# Patient Record
Sex: Female | Born: 1980 | Race: White | Hispanic: No
Health system: Southern US, Community
[De-identification: ages and names within clinical notes are randomized; demographics above are authoritative.]

---

## 1999-01-30 ENCOUNTER — Other Ambulatory Visit: Admission: RE | Admit: 1999-01-30 | Discharge: 1999-01-30 | Payer: Self-pay | Admitting: Gynecology

## 2009-03-01 ENCOUNTER — Other Ambulatory Visit: Admission: RE | Admit: 2009-03-01 | Discharge: 2009-03-01 | Payer: Self-pay | Admitting: Family Medicine

## 2010-03-04 ENCOUNTER — Other Ambulatory Visit: Admission: RE | Admit: 2010-03-04 | Discharge: 2010-03-04 | Payer: Self-pay | Admitting: Family Medicine

## 2010-03-31 ENCOUNTER — Emergency Department (HOSPITAL_COMMUNITY)
Admission: EM | Admit: 2010-03-31 | Discharge: 2010-03-31 | Payer: Self-pay | Source: Home / Self Care | Admitting: Emergency Medicine

## 2010-06-09 ENCOUNTER — Emergency Department (HOSPITAL_COMMUNITY)
Admission: EM | Admit: 2010-06-09 | Discharge: 2010-06-09 | Payer: Self-pay | Source: Home / Self Care | Admitting: Family Medicine

## 2010-08-06 ENCOUNTER — Inpatient Hospital Stay (INDEPENDENT_AMBULATORY_CARE_PROVIDER_SITE_OTHER)
Admission: RE | Admit: 2010-08-06 | Discharge: 2010-08-06 | Disposition: A | Discharge status: Home / Self Care | Payer: Commercial Managed Care - PPO | Source: Ambulatory Visit | Attending: Emergency Medicine | Admitting: Emergency Medicine

## 2010-08-06 DIAGNOSIS — J069 Acute upper respiratory infection, unspecified: Secondary | ICD-10-CM

## 2011-07-24 ENCOUNTER — Other Ambulatory Visit (HOSPITAL_COMMUNITY)
Admission: RE | Admit: 2011-07-24 | Discharge: 2011-07-24 | Disposition: A | Discharge status: Home / Self Care | Payer: 59 | Source: Ambulatory Visit | Attending: Family Medicine | Admitting: Family Medicine

## 2011-07-24 ENCOUNTER — Other Ambulatory Visit: Payer: Self-pay | Admitting: Physician Assistant

## 2011-07-24 DIAGNOSIS — Z01419 Encounter for gynecological examination (general) (routine) without abnormal findings: Secondary | ICD-10-CM | POA: Insufficient documentation

## 2015-01-11 ENCOUNTER — Other Ambulatory Visit: Payer: Self-pay | Admitting: Obstetrics & Gynecology

## 2015-01-11 ENCOUNTER — Other Ambulatory Visit (HOSPITAL_COMMUNITY)
Admission: RE | Admit: 2015-01-11 | Discharge: 2015-01-11 | Disposition: A | Discharge status: Home / Self Care | Payer: Managed Care, Other (non HMO) | Source: Ambulatory Visit | Attending: Obstetrics & Gynecology | Admitting: Obstetrics & Gynecology

## 2015-01-11 DIAGNOSIS — Z1151 Encounter for screening for human papillomavirus (HPV): Secondary | ICD-10-CM | POA: Diagnosis present

## 2015-01-11 DIAGNOSIS — Z01419 Encounter for gynecological examination (general) (routine) without abnormal findings: Secondary | ICD-10-CM | POA: Diagnosis not present

## 2015-01-15 LAB — CYTOLOGY - PAP

## 2019-05-08 ENCOUNTER — Other Ambulatory Visit: Payer: Self-pay | Admitting: Physician Assistant

## 2019-05-08 ENCOUNTER — Other Ambulatory Visit (HOSPITAL_COMMUNITY)
Admission: RE | Admit: 2019-05-08 | Discharge: 2019-05-08 | Disposition: A | Discharge status: Home / Self Care | Payer: Managed Care, Other (non HMO) | Source: Ambulatory Visit | Attending: Family Medicine | Admitting: Family Medicine

## 2019-05-08 DIAGNOSIS — Z01419 Encounter for gynecological examination (general) (routine) without abnormal findings: Secondary | ICD-10-CM | POA: Diagnosis present

## 2019-05-11 LAB — CYTOLOGY - PAP
Adequacy: ABSENT
Diagnosis: NEGATIVE

## 2019-05-12 ENCOUNTER — Other Ambulatory Visit: Payer: Self-pay | Admitting: Physician Assistant

## 2019-05-12 DIAGNOSIS — Z1231 Encounter for screening mammogram for malignant neoplasm of breast: Secondary | ICD-10-CM

## 2019-07-07 ENCOUNTER — Ambulatory Visit: Payer: 59

## 2019-08-14 ENCOUNTER — Ambulatory Visit: Payer: 59

## 2019-08-22 ENCOUNTER — Other Ambulatory Visit: Payer: Self-pay

## 2019-08-22 ENCOUNTER — Ambulatory Visit
Admission: RE | Admit: 2019-08-22 | Discharge: 2019-08-22 | Disposition: A | Discharge status: Home / Self Care | Payer: Managed Care, Other (non HMO) | Source: Ambulatory Visit | Attending: Physician Assistant | Admitting: Physician Assistant

## 2019-08-22 DIAGNOSIS — Z1231 Encounter for screening mammogram for malignant neoplasm of breast: Secondary | ICD-10-CM

## 2019-08-24 ENCOUNTER — Other Ambulatory Visit: Payer: Self-pay | Admitting: Physician Assistant

## 2019-08-24 DIAGNOSIS — R928 Other abnormal and inconclusive findings on diagnostic imaging of breast: Secondary | ICD-10-CM

## 2019-09-01 ENCOUNTER — Ambulatory Visit
Admission: RE | Admit: 2019-09-01 | Discharge: 2019-09-01 | Disposition: A | Discharge status: Home / Self Care | Payer: Managed Care, Other (non HMO) | Source: Ambulatory Visit | Attending: Physician Assistant | Admitting: Physician Assistant

## 2019-09-01 ENCOUNTER — Other Ambulatory Visit: Payer: Self-pay | Admitting: Physician Assistant

## 2019-09-01 ENCOUNTER — Other Ambulatory Visit: Payer: Self-pay

## 2019-09-01 ENCOUNTER — Ambulatory Visit: Payer: 59

## 2019-09-01 ENCOUNTER — Ambulatory Visit
Admission: RE | Admit: 2019-09-01 | Discharge: 2019-09-01 | Disposition: A | Discharge status: Home / Self Care | Payer: 59 | Source: Ambulatory Visit | Attending: Physician Assistant | Admitting: Physician Assistant

## 2019-09-01 DIAGNOSIS — R928 Other abnormal and inconclusive findings on diagnostic imaging of breast: Secondary | ICD-10-CM

## 2020-02-27 ENCOUNTER — Other Ambulatory Visit: Payer: Self-pay | Admitting: Physician Assistant

## 2020-02-27 DIAGNOSIS — Z8249 Family history of ischemic heart disease and other diseases of the circulatory system: Secondary | ICD-10-CM

## 2020-03-01 ENCOUNTER — Ambulatory Visit
Admission: RE | Admit: 2020-03-01 | Discharge: 2020-03-01 | Disposition: A | Discharge status: Home / Self Care | Payer: Managed Care, Other (non HMO) | Source: Ambulatory Visit | Attending: Physician Assistant | Admitting: Physician Assistant

## 2020-03-01 ENCOUNTER — Other Ambulatory Visit: Payer: Self-pay

## 2020-03-01 DIAGNOSIS — Z8249 Family history of ischemic heart disease and other diseases of the circulatory system: Secondary | ICD-10-CM

## 2020-03-06 ENCOUNTER — Other Ambulatory Visit: Payer: Self-pay | Admitting: Physician Assistant

## 2020-03-06 ENCOUNTER — Ambulatory Visit
Admission: RE | Admit: 2020-03-06 | Discharge: 2020-03-06 | Disposition: A | Discharge status: Home / Self Care | Payer: Managed Care, Other (non HMO) | Source: Ambulatory Visit | Attending: Physician Assistant | Admitting: Physician Assistant

## 2020-03-06 ENCOUNTER — Other Ambulatory Visit: Payer: Self-pay

## 2020-03-06 DIAGNOSIS — R928 Other abnormal and inconclusive findings on diagnostic imaging of breast: Secondary | ICD-10-CM

## 2020-05-08 ENCOUNTER — Ambulatory Visit: Payer: Managed Care, Other (non HMO) | Attending: Internal Medicine

## 2020-05-08 DIAGNOSIS — Z23 Encounter for immunization: Secondary | ICD-10-CM

## 2020-05-08 NOTE — Progress Notes (Signed)
   Cov[facility] Vaccination [facility]  Name:  BIJAL SIGLIN    MRN: [id number] DOB: 05-Jun-1981  05/08/2020  Ms. Market was observed post Covid-19 immunization for 15 minutes without incident. She was provided with Vaccine Information Sheet and instruction to access the V-Safe system.   Ms. Caison was instructed to call 911 with any severe reactions post vaccine: Marland Kitchen Difficulty breathing  . Swelling of face and throat  . A fast heartbeat  . A bad rash all over body  . Dizziness and weakness   Immunizations Administered    Name Date Dose VIS Date Route   Pfizer COVID-19 Vaccine 05/08/2020 11:30 AM 0.3 mL 04/10/2020 Intramuscular   Manufacturer: [employer], [employer]   Lot: [id number]   NDC: [address]-3

## 2020-05-10 ENCOUNTER — Other Ambulatory Visit (HOSPITAL_COMMUNITY): Payer: Self-pay | Admitting: Internal Medicine

## 2020-09-04 ENCOUNTER — Ambulatory Visit
Admission: RE | Admit: 2020-09-04 | Discharge: 2020-09-04 | Disposition: A | Discharge status: Home / Self Care | Payer: Managed Care, Other (non HMO) | Source: Ambulatory Visit | Attending: Physician Assistant | Admitting: Physician Assistant

## 2020-09-04 ENCOUNTER — Other Ambulatory Visit: Payer: Self-pay

## 2020-09-04 DIAGNOSIS — R922 Inconclusive mammogram: Secondary | ICD-10-CM | POA: Diagnosis not present

## 2020-09-04 DIAGNOSIS — R928 Other abnormal and inconclusive findings on diagnostic imaging of breast: Secondary | ICD-10-CM

## 2020-09-04 DIAGNOSIS — N6323 Unspecified lump in the left breast, lower outer quadrant: Secondary | ICD-10-CM | POA: Diagnosis not present

## 2020-10-08 DIAGNOSIS — Z20822 Contact with and (suspected) exposure to covid-19: Secondary | ICD-10-CM | POA: Diagnosis not present

## 2020-11-08 DIAGNOSIS — B029 Zoster without complications: Secondary | ICD-10-CM | POA: Diagnosis not present

## 2021-05-09 DIAGNOSIS — Z23 Encounter for immunization: Secondary | ICD-10-CM | POA: Diagnosis not present

## 2021-05-09 DIAGNOSIS — E7800 Pure hypercholesterolemia, unspecified: Secondary | ICD-10-CM | POA: Diagnosis not present

## 2021-05-09 DIAGNOSIS — Z5181 Encounter for therapeutic drug level monitoring: Secondary | ICD-10-CM | POA: Diagnosis not present

## 2021-05-09 DIAGNOSIS — F339 Major depressive disorder, recurrent, unspecified: Secondary | ICD-10-CM | POA: Diagnosis not present

## 2021-05-09 DIAGNOSIS — Z0000 Encounter for general adult medical examination without abnormal findings: Secondary | ICD-10-CM | POA: Diagnosis not present

## 2021-10-06 ENCOUNTER — Other Ambulatory Visit: Payer: Self-pay | Admitting: Physician Assistant

## 2021-10-06 DIAGNOSIS — N6320 Unspecified lump in the left breast, unspecified quadrant: Secondary | ICD-10-CM

## 2021-11-10 ENCOUNTER — Ambulatory Visit
Admission: RE | Admit: 2021-11-10 | Discharge: 2021-11-10 | Disposition: A | Discharge status: Home / Self Care | Payer: BC Managed Care – PPO | Source: Ambulatory Visit | Attending: Physician Assistant | Admitting: Physician Assistant

## 2021-11-10 DIAGNOSIS — R928 Other abnormal and inconclusive findings on diagnostic imaging of breast: Secondary | ICD-10-CM | POA: Diagnosis not present

## 2021-11-10 DIAGNOSIS — N6323 Unspecified lump in the left breast, lower outer quadrant: Secondary | ICD-10-CM | POA: Diagnosis not present

## 2021-11-10 DIAGNOSIS — N6320 Unspecified lump in the left breast, unspecified quadrant: Secondary | ICD-10-CM

## 2021-11-13 DIAGNOSIS — M940 Chondrocostal junction syndrome [Tietze]: Secondary | ICD-10-CM | POA: Diagnosis not present

## 2021-12-18 DIAGNOSIS — R635 Abnormal weight gain: Secondary | ICD-10-CM | POA: Diagnosis not present

## 2021-12-18 DIAGNOSIS — E559 Vitamin D deficiency, unspecified: Secondary | ICD-10-CM | POA: Diagnosis not present

## 2021-12-18 DIAGNOSIS — N951 Menopausal and female climacteric states: Secondary | ICD-10-CM | POA: Diagnosis not present

## 2021-12-24 DIAGNOSIS — M79671 Pain in right foot: Secondary | ICD-10-CM | POA: Diagnosis not present

## 2021-12-24 DIAGNOSIS — R635 Abnormal weight gain: Secondary | ICD-10-CM | POA: Diagnosis not present

## 2021-12-29 DIAGNOSIS — M722 Plantar fascial fibromatosis: Secondary | ICD-10-CM | POA: Diagnosis not present

## 2021-12-29 DIAGNOSIS — Z6836 Body mass index (BMI) 36.0-36.9, adult: Secondary | ICD-10-CM | POA: Diagnosis not present

## 2022-01-02 DIAGNOSIS — E785 Hyperlipidemia, unspecified: Secondary | ICD-10-CM | POA: Diagnosis not present

## 2022-01-02 DIAGNOSIS — R635 Abnormal weight gain: Secondary | ICD-10-CM | POA: Diagnosis not present

## 2022-01-02 DIAGNOSIS — Z1331 Encounter for screening for depression: Secondary | ICD-10-CM | POA: Diagnosis not present

## 2022-01-02 DIAGNOSIS — Z1339 Encounter for screening examination for other mental health and behavioral disorders: Secondary | ICD-10-CM | POA: Diagnosis not present

## 2022-01-02 DIAGNOSIS — E559 Vitamin D deficiency, unspecified: Secondary | ICD-10-CM | POA: Diagnosis not present

## 2022-01-02 DIAGNOSIS — N951 Menopausal and female climacteric states: Secondary | ICD-10-CM | POA: Diagnosis not present

## 2022-01-09 DIAGNOSIS — Z6836 Body mass index (BMI) 36.0-36.9, adult: Secondary | ICD-10-CM | POA: Diagnosis not present

## 2022-01-09 DIAGNOSIS — E559 Vitamin D deficiency, unspecified: Secondary | ICD-10-CM | POA: Diagnosis not present

## 2022-01-16 DIAGNOSIS — Z6835 Body mass index (BMI) 35.0-35.9, adult: Secondary | ICD-10-CM | POA: Diagnosis not present

## 2022-01-16 DIAGNOSIS — E039 Hypothyroidism, unspecified: Secondary | ICD-10-CM | POA: Diagnosis not present

## 2022-01-27 DIAGNOSIS — E559 Vitamin D deficiency, unspecified: Secondary | ICD-10-CM | POA: Diagnosis not present

## 2022-01-27 DIAGNOSIS — Z6835 Body mass index (BMI) 35.0-35.9, adult: Secondary | ICD-10-CM | POA: Diagnosis not present

## 2022-01-28 DIAGNOSIS — M722 Plantar fascial fibromatosis: Secondary | ICD-10-CM | POA: Diagnosis not present

## 2022-01-30 DIAGNOSIS — Z130 Encounter for screening for diseases of the blood and blood-forming organs and certain disorders involving the immune mechanism: Secondary | ICD-10-CM | POA: Diagnosis not present

## 2022-01-30 DIAGNOSIS — E039 Hypothyroidism, unspecified: Secondary | ICD-10-CM | POA: Diagnosis not present

## 2022-02-04 DIAGNOSIS — E039 Hypothyroidism, unspecified: Secondary | ICD-10-CM | POA: Diagnosis not present

## 2022-02-11 DIAGNOSIS — E559 Vitamin D deficiency, unspecified: Secondary | ICD-10-CM | POA: Diagnosis not present

## 2022-02-11 DIAGNOSIS — Z6833 Body mass index (BMI) 33.0-33.9, adult: Secondary | ICD-10-CM | POA: Diagnosis not present

## 2022-02-18 DIAGNOSIS — Z6833 Body mass index (BMI) 33.0-33.9, adult: Secondary | ICD-10-CM | POA: Diagnosis not present

## 2022-02-18 DIAGNOSIS — E039 Hypothyroidism, unspecified: Secondary | ICD-10-CM | POA: Diagnosis not present

## 2022-03-09 DIAGNOSIS — E785 Hyperlipidemia, unspecified: Secondary | ICD-10-CM | POA: Diagnosis not present

## 2022-03-09 DIAGNOSIS — Z6833 Body mass index (BMI) 33.0-33.9, adult: Secondary | ICD-10-CM | POA: Diagnosis not present

## 2022-03-17 DIAGNOSIS — Z6832 Body mass index (BMI) 32.0-32.9, adult: Secondary | ICD-10-CM | POA: Diagnosis not present

## 2022-03-17 DIAGNOSIS — E039 Hypothyroidism, unspecified: Secondary | ICD-10-CM | POA: Diagnosis not present

## 2022-03-24 DIAGNOSIS — E785 Hyperlipidemia, unspecified: Secondary | ICD-10-CM | POA: Diagnosis not present

## 2022-03-24 DIAGNOSIS — Z6832 Body mass index (BMI) 32.0-32.9, adult: Secondary | ICD-10-CM | POA: Diagnosis not present

## 2022-04-03 DIAGNOSIS — E785 Hyperlipidemia, unspecified: Secondary | ICD-10-CM | POA: Diagnosis not present

## 2022-04-03 DIAGNOSIS — E039 Hypothyroidism, unspecified: Secondary | ICD-10-CM | POA: Diagnosis not present

## 2022-04-03 DIAGNOSIS — Z131 Encounter for screening for diabetes mellitus: Secondary | ICD-10-CM | POA: Diagnosis not present

## 2022-04-03 DIAGNOSIS — Z130 Encounter for screening for diseases of the blood and blood-forming organs and certain disorders involving the immune mechanism: Secondary | ICD-10-CM | POA: Diagnosis not present

## 2022-04-03 DIAGNOSIS — Z6832 Body mass index (BMI) 32.0-32.9, adult: Secondary | ICD-10-CM | POA: Diagnosis not present

## 2022-04-03 DIAGNOSIS — E559 Vitamin D deficiency, unspecified: Secondary | ICD-10-CM | POA: Diagnosis not present

## 2022-04-03 DIAGNOSIS — N951 Menopausal and female climacteric states: Secondary | ICD-10-CM | POA: Diagnosis not present

## 2022-04-13 DIAGNOSIS — N951 Menopausal and female climacteric states: Secondary | ICD-10-CM | POA: Diagnosis not present

## 2022-04-13 DIAGNOSIS — Z6833 Body mass index (BMI) 33.0-33.9, adult: Secondary | ICD-10-CM | POA: Diagnosis not present

## 2022-04-13 DIAGNOSIS — E039 Hypothyroidism, unspecified: Secondary | ICD-10-CM | POA: Diagnosis not present

## 2022-04-13 DIAGNOSIS — E559 Vitamin D deficiency, unspecified: Secondary | ICD-10-CM | POA: Diagnosis not present

## 2022-04-21 DIAGNOSIS — Z6832 Body mass index (BMI) 32.0-32.9, adult: Secondary | ICD-10-CM | POA: Diagnosis not present

## 2022-04-21 DIAGNOSIS — E785 Hyperlipidemia, unspecified: Secondary | ICD-10-CM | POA: Diagnosis not present

## 2022-05-01 DIAGNOSIS — E039 Hypothyroidism, unspecified: Secondary | ICD-10-CM | POA: Diagnosis not present

## 2022-05-01 DIAGNOSIS — Z6832 Body mass index (BMI) 32.0-32.9, adult: Secondary | ICD-10-CM | POA: Diagnosis not present

## 2022-05-08 DIAGNOSIS — Z6833 Body mass index (BMI) 33.0-33.9, adult: Secondary | ICD-10-CM | POA: Diagnosis not present

## 2022-05-08 DIAGNOSIS — E785 Hyperlipidemia, unspecified: Secondary | ICD-10-CM | POA: Diagnosis not present

## 2022-05-08 DIAGNOSIS — E039 Hypothyroidism, unspecified: Secondary | ICD-10-CM | POA: Diagnosis not present

## 2022-05-12 DIAGNOSIS — Z124 Encounter for screening for malignant neoplasm of cervix: Secondary | ICD-10-CM | POA: Diagnosis not present

## 2022-05-12 DIAGNOSIS — E7800 Pure hypercholesterolemia, unspecified: Secondary | ICD-10-CM | POA: Diagnosis not present

## 2022-05-12 DIAGNOSIS — Z0000 Encounter for general adult medical examination without abnormal findings: Secondary | ICD-10-CM | POA: Diagnosis not present

## 2022-05-12 DIAGNOSIS — F339 Major depressive disorder, recurrent, unspecified: Secondary | ICD-10-CM | POA: Diagnosis not present

## 2022-05-18 DIAGNOSIS — Z6833 Body mass index (BMI) 33.0-33.9, adult: Secondary | ICD-10-CM | POA: Diagnosis not present

## 2022-05-18 DIAGNOSIS — E039 Hypothyroidism, unspecified: Secondary | ICD-10-CM | POA: Diagnosis not present

## 2022-05-18 DIAGNOSIS — N951 Menopausal and female climacteric states: Secondary | ICD-10-CM | POA: Diagnosis not present

## 2022-05-25 DIAGNOSIS — N951 Menopausal and female climacteric states: Secondary | ICD-10-CM | POA: Diagnosis not present

## 2022-05-25 DIAGNOSIS — E785 Hyperlipidemia, unspecified: Secondary | ICD-10-CM | POA: Diagnosis not present

## 2022-05-25 DIAGNOSIS — Z6833 Body mass index (BMI) 33.0-33.9, adult: Secondary | ICD-10-CM | POA: Diagnosis not present

## 2022-06-01 DIAGNOSIS — E039 Hypothyroidism, unspecified: Secondary | ICD-10-CM | POA: Diagnosis not present

## 2022-06-01 DIAGNOSIS — Z6832 Body mass index (BMI) 32.0-32.9, adult: Secondary | ICD-10-CM | POA: Diagnosis not present

## 2022-06-08 DIAGNOSIS — R7989 Other specified abnormal findings of blood chemistry: Secondary | ICD-10-CM | POA: Diagnosis not present

## 2022-06-08 DIAGNOSIS — E559 Vitamin D deficiency, unspecified: Secondary | ICD-10-CM | POA: Diagnosis not present

## 2022-06-08 DIAGNOSIS — E785 Hyperlipidemia, unspecified: Secondary | ICD-10-CM | POA: Diagnosis not present

## 2022-06-08 DIAGNOSIS — E663 Overweight: Secondary | ICD-10-CM | POA: Diagnosis not present

## 2022-06-08 DIAGNOSIS — E039 Hypothyroidism, unspecified: Secondary | ICD-10-CM | POA: Diagnosis not present

## 2022-06-08 DIAGNOSIS — N951 Menopausal and female climacteric states: Secondary | ICD-10-CM | POA: Diagnosis not present

## 2022-06-18 DIAGNOSIS — E039 Hypothyroidism, unspecified: Secondary | ICD-10-CM | POA: Diagnosis not present

## 2022-06-18 DIAGNOSIS — E785 Hyperlipidemia, unspecified: Secondary | ICD-10-CM | POA: Diagnosis not present

## 2022-06-18 DIAGNOSIS — Z6832 Body mass index (BMI) 32.0-32.9, adult: Secondary | ICD-10-CM | POA: Diagnosis not present

## 2022-06-18 DIAGNOSIS — N951 Menopausal and female climacteric states: Secondary | ICD-10-CM | POA: Diagnosis not present

## 2022-12-04 IMAGING — MG DIGITAL DIAGNOSTIC BILAT W/ TOMO W/ CAD
8 series · 8 of 24 positions shown · non-contrast
Comparison: Previous exam(s).

CLINICAL DATA: Two+ year follow-up of probable benign masses in the
left breast at [DATE] 4 cm from the nipple.

EXAM:
DIGITAL DIAGNOSTIC BILATERAL MAMMOGRAM WITH TOMOSYNTHESIS AND CAD;
ULTRASOUND LEFT BREAST LIMITED
TECHNIQUE: Bilateral digital diagnostic mammography and breast tomosynthesis
was performed. The images were evaluated with computer-aided
detection.; Targeted ultrasound examination of the left breast was
performed.

[R CC synth-2D]
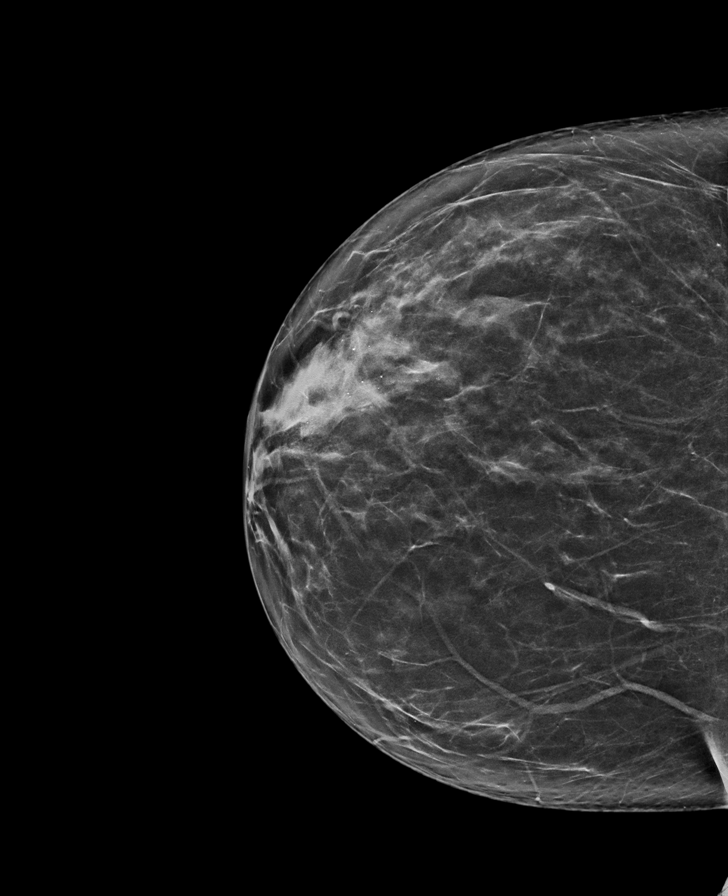

[L MLO synth-2D]
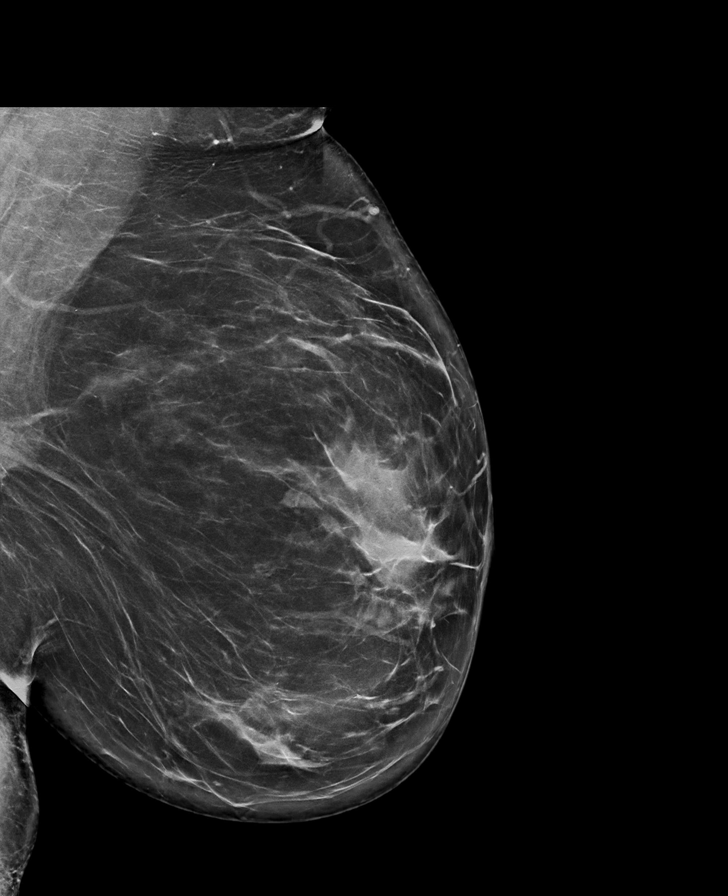

[L CC synth-2D]
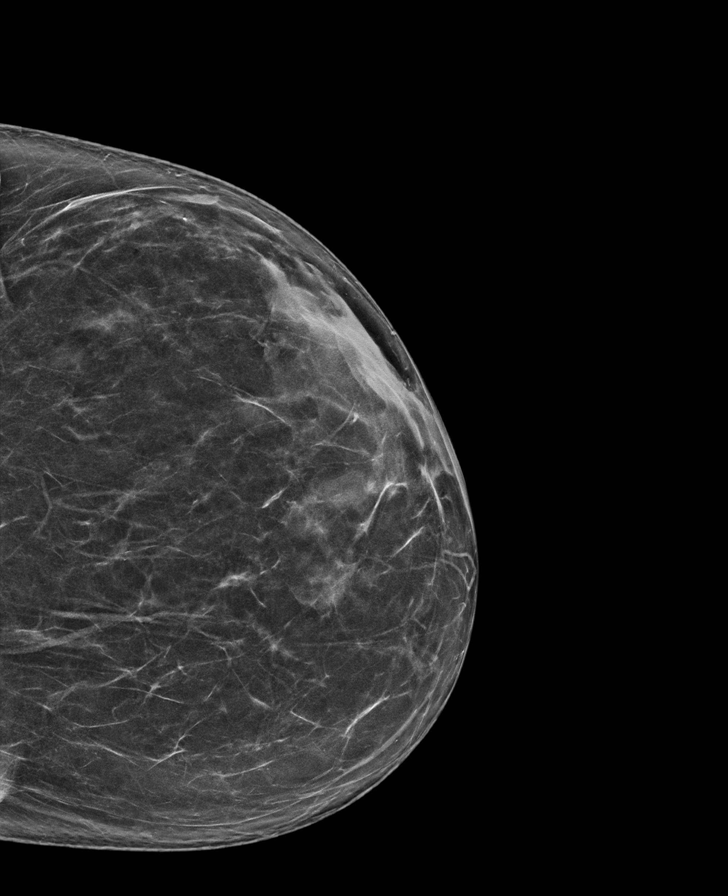

[R MLO synth-2D]
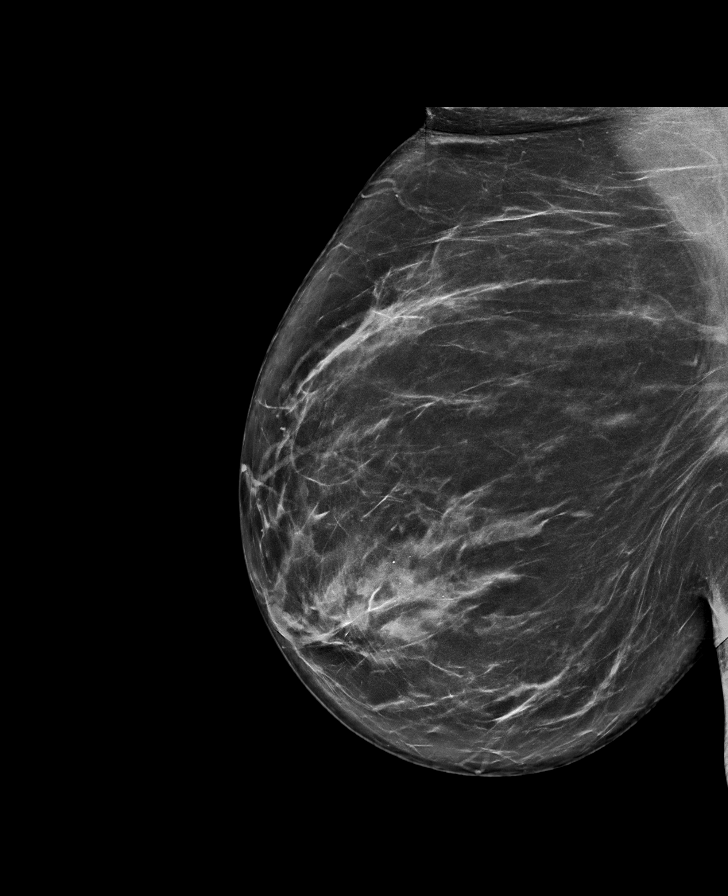

[L CC tomo · tomo slice 37/74.0]
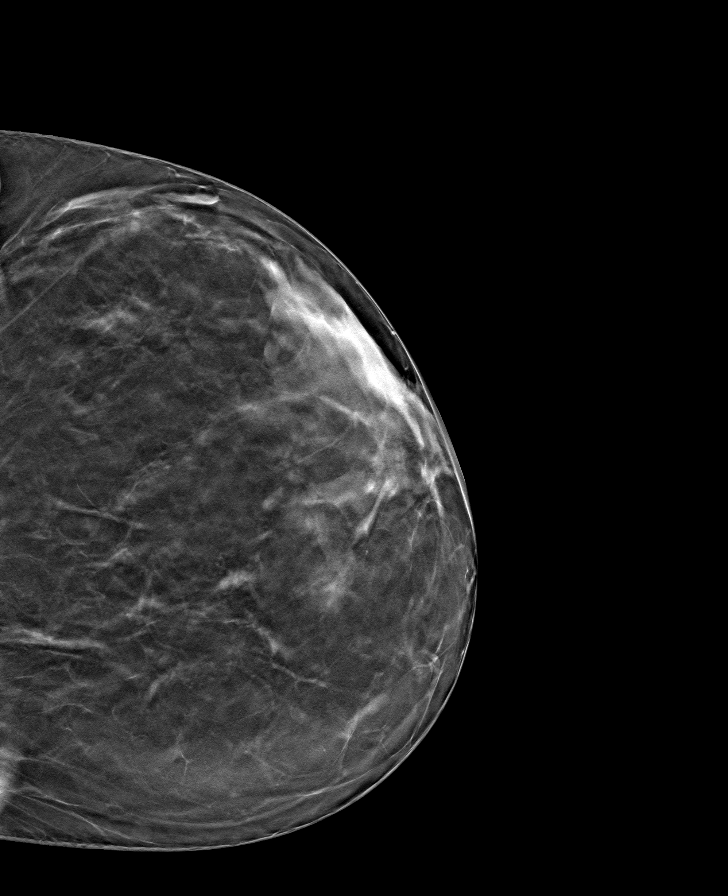

[R CC tomo · tomo slice 37/74.0]
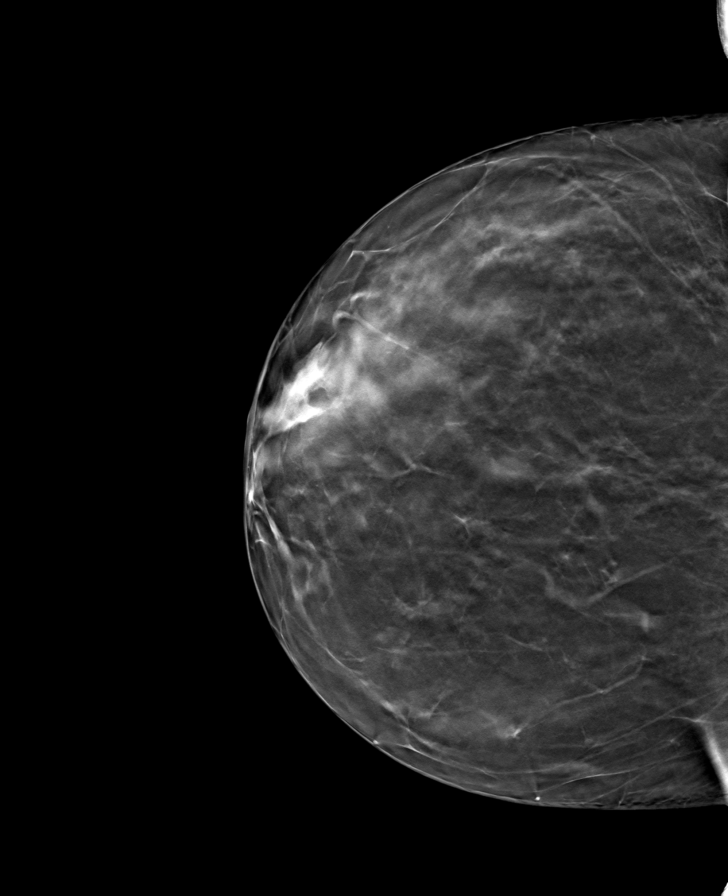

[R MLO tomo · tomo slice 45/90.0]
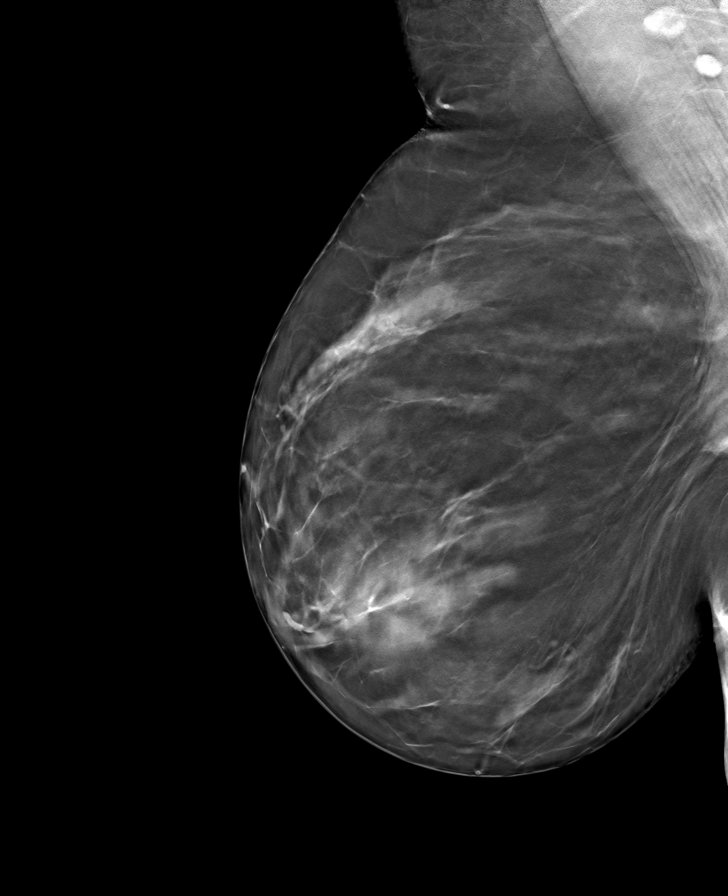

[L MLO tomo · tomo slice 45/89.0]
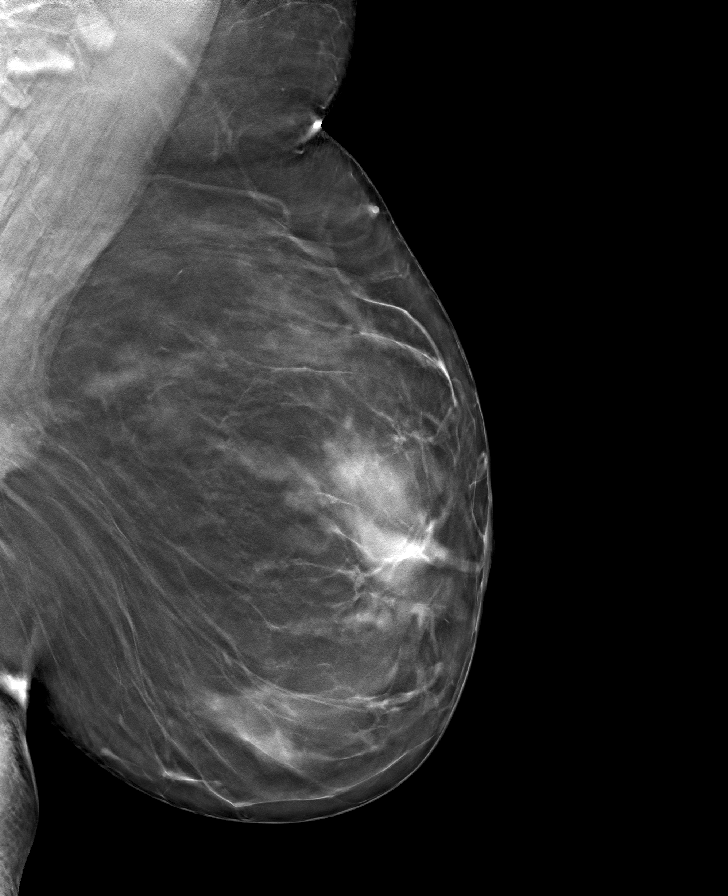

[8 of 24 positions shown; findings below may reference images not displayed]

ACR Breast Density Category b: There are scattered areas of
fibroglandular density.
FINDINGS: No suspicious mass or malignant type microcalcifications identified
in either breast.

Targeted ultrasound is performed, showing 2 stable hypoechoic masses
in the left breast at [DATE] 4 cm from the nipple measuring 5 x 4 x 3
mm and 5 x 3 x 4 mm. On the prior ultrasound dated 09/01/2019 they
measured 7 x 4 x 4 mm and 4 x 4 x 4 mm.
IMPRESSION: Stable benign-appearing masses in the [DATE] region of the left
breast. No evidence of malignancy in either breast.

RECOMMENDATION:
Bilateral screening mammogram in 1 year is recommended.

I have discussed the findings and recommendations with the patient.
If applicable, a reminder letter will be sent to the patient
regarding the next appointment.

BI-RADS CATEGORY  2: Benign.

## 2024-06-30 ENCOUNTER — Other Ambulatory Visit: Payer: Self-pay | Admitting: Physician Assistant

## 2024-06-30 DIAGNOSIS — Z1231 Encounter for screening mammogram for malignant neoplasm of breast: Secondary | ICD-10-CM

## 2024-07-04 ENCOUNTER — Inpatient Hospital Stay
Admission: RE | Admit: 2024-07-04 | Discharge: 2024-07-04 | Attending: Physician Assistant | Admitting: Physician Assistant

## 2024-07-04 DIAGNOSIS — Z1231 Encounter for screening mammogram for malignant neoplasm of breast: Secondary | ICD-10-CM
